# Patient Record
Sex: Female | Born: 1975 | Race: Black or African American | Hispanic: No | Marital: Married | Smoking: Never smoker
Health system: Southern US, Community
[De-identification: ages and names within clinical notes are randomized; demographics above are authoritative.]

---

## 1997-06-02 ENCOUNTER — Inpatient Hospital Stay (HOSPITAL_COMMUNITY): Admission: AD | Admit: 1997-06-02 | Discharge: 1997-06-02 | Payer: Self-pay | Admitting: Gynecology

## 1997-08-04 ENCOUNTER — Inpatient Hospital Stay (HOSPITAL_COMMUNITY): Admission: AD | Admit: 1997-08-04 | Discharge: 1997-08-04 | Payer: Self-pay | Admitting: Gynecology

## 1997-10-23 ENCOUNTER — Inpatient Hospital Stay (HOSPITAL_COMMUNITY): Admission: AD | Admit: 1997-10-23 | Discharge: 1997-10-28 | Payer: Self-pay | Admitting: Gynecology

## 1998-09-16 ENCOUNTER — Emergency Department (HOSPITAL_COMMUNITY): Admission: EM | Admit: 1998-09-16 | Discharge: 1998-09-16 | Payer: Self-pay | Admitting: Emergency Medicine

## 1999-06-10 ENCOUNTER — Ambulatory Visit (HOSPITAL_COMMUNITY): Admission: RE | Admit: 1999-06-10 | Discharge: 1999-06-10 | Payer: Self-pay | Admitting: *Deleted

## 1999-06-17 ENCOUNTER — Inpatient Hospital Stay (HOSPITAL_COMMUNITY): Admission: AD | Admit: 1999-06-17 | Discharge: 1999-06-17 | Payer: Self-pay | Admitting: Obstetrics & Gynecology

## 1999-06-21 ENCOUNTER — Inpatient Hospital Stay (HOSPITAL_COMMUNITY): Admission: AD | Admit: 1999-06-21 | Discharge: 1999-06-21 | Payer: Self-pay | Admitting: Obstetrics

## 1999-06-24 ENCOUNTER — Encounter: Admission: RE | Admit: 1999-06-24 | Discharge: 1999-06-24 | Payer: Self-pay | Admitting: Obstetrics

## 1999-08-05 ENCOUNTER — Inpatient Hospital Stay (HOSPITAL_COMMUNITY): Admission: AD | Admit: 1999-08-05 | Discharge: 1999-08-07 | Payer: Self-pay | Admitting: *Deleted

## 1999-08-05 ENCOUNTER — Encounter (INDEPENDENT_AMBULATORY_CARE_PROVIDER_SITE_OTHER): Payer: Self-pay

## 2003-02-27 ENCOUNTER — Emergency Department (HOSPITAL_COMMUNITY): Admission: EM | Admit: 2003-02-27 | Discharge: 2003-02-27 | Payer: Self-pay | Admitting: Emergency Medicine

## 2008-07-16 ENCOUNTER — Emergency Department (HOSPITAL_COMMUNITY): Admission: EM | Admit: 2008-07-16 | Discharge: 2008-07-16 | Payer: Self-pay | Admitting: Emergency Medicine

## 2009-05-12 ENCOUNTER — Inpatient Hospital Stay (HOSPITAL_COMMUNITY): Admission: AD | Admit: 2009-05-12 | Discharge: 2009-05-12 | Payer: Self-pay | Admitting: Obstetrics & Gynecology

## 2010-04-21 LAB — WET PREP, GENITAL

## 2010-04-21 LAB — GC/CHLAMYDIA PROBE AMP, GENITAL
Chlamydia, DNA Probe: NEGATIVE
GC Probe Amp, Genital: NEGATIVE

## 2010-04-21 LAB — SAMPLE TO BLOOD BANK

## 2010-04-21 LAB — CBC
HCT: 41 % (ref 36.0–46.0)
Hemoglobin: 13.4 g/dL (ref 12.0–15.0)
MCHC: 32.8 g/dL (ref 30.0–36.0)
MCV: 84.4 fL (ref 78.0–100.0)
Platelets: 197 10*3/uL (ref 150–400)
RBC: 4.86 MIL/uL (ref 3.87–5.11)
RDW: 12.9 % (ref 11.5–15.5)
WBC: 6.9 10*3/uL (ref 4.0–10.5)

## 2010-04-21 LAB — POCT PREGNANCY, URINE: Preg Test, Ur: NEGATIVE

## 2010-06-18 NOTE — Op Note (Signed)
[facility]  Patient:    Mary Roy, Mary Roy                   MRN: [id number] Proc. Date: 08/06/99 Adm. Date:  [id number] Attending:  Michaelle Copas                           Operative Report  PREOPERATIVE DIAGNOSIS:       Desires sterilization.  POSTOPERATIVE DIAGNOSIS:      Desires sterilization.  OPERATION:                    Bilateral partial salpingectomy ([place]                               technique).  SURGEON:                      Charles A. Clearance Coots, M.D.  ANESTHESIA:                   General.  ESTIMATED BLOOD LOSS:         Negligible.  COMPLICATIONS:                None.  SPECIMENS:                    Approximately 2-cm segments of right and left                               fallopian tube.  DESCRIPTION OF PROCEDURE:     The patient was brought to the operating room and after satisfactory general endotracheal anesthesia, the abdomen was prepped and draped in the usual sterile fashion.  A small inferior umbilical incision was made with the scalpel.  It was deepened down to the fascia with curved Mayo scissors.  The fascia was grasped in the midline with Kelly forceps and was cut transversely with curved Mayo scissors.  The incision was extended to the left and to the right with the curved Mayo scissors.  The peritoneum was grasped with hemostats and was incised with Metzenbaum scissors , and the incision was extended to the left and to the right with the Metzenbaum scissors.  Right angle retractors were placed in the incision, and the right fallopian tube was identified and was grasped with a Babcock clamp. The tube was followed from the cornual end to the fimbrial end serially, and then grasped with Babcock clamps, and then the tube was followed retrograde back to the isthmic area of the tube, and grasped with Babcock clamps and the knuckle of tube beneath the Babcock clamp in the isthmic area of the tube was doubly  ligated with #1 plain catgut, and the section of tube above the knot was excised with Metzenbaum scissors and submitted to pathology for evaluation.  There was no active bleeding from the tubal stump.  It was therefore placed back in the normal anatomic position.  The same procedure was performed on the opposite side without complications.  The abdomen was then closed as follows:  Peritoneum and fascia were closed as one with a continuous suture of 2-0 Vicryl.  The subcutaneous tissue was approximated with a few interrupted sutures of 2-0 Vicryl.  The skin was closed with a subcuticular continuous suture of 4-0 Monocryl.  Sterile  bandage was applied to the incision closure.  Surgical technician indicated all needle, sponge, and instrument counts were correct.  The patient tolerated the procedure well and was transported to the recovery room in satisfactory condition. DD:  08/06/99 TD:  08/06/99 Job: [id number]

## 2015-07-07 ENCOUNTER — Emergency Department (HOSPITAL_COMMUNITY)
Admission: EM | Admit: 2015-07-07 | Discharge: 2015-07-07 | Disposition: A | Discharge status: Home / Self Care | Payer: Self-pay | Attending: Emergency Medicine | Admitting: Emergency Medicine

## 2015-07-07 ENCOUNTER — Encounter (HOSPITAL_COMMUNITY): Payer: Self-pay

## 2015-07-07 DIAGNOSIS — H5789 Other specified disorders of eye and adnexa: Secondary | ICD-10-CM

## 2015-07-07 DIAGNOSIS — H578 Other specified disorders of eye and adnexa: Secondary | ICD-10-CM | POA: Insufficient documentation

## 2015-07-07 DIAGNOSIS — H53149 Visual discomfort, unspecified: Secondary | ICD-10-CM | POA: Insufficient documentation

## 2015-07-07 DIAGNOSIS — R32 Unspecified urinary incontinence: Secondary | ICD-10-CM | POA: Insufficient documentation

## 2015-07-07 MED ORDER — FLUORESCEIN SODIUM 1 MG OP STRP
1.0000 | ORAL_STRIP | Freq: Once | OPHTHALMIC | Status: AC
  Administered 2015-07-07: 1 via OPHTHALMIC
  Filled 2015-07-07: qty 1

## 2015-07-07 MED ORDER — PROPARACAINE HCL 0.5 % OP SOLN
1.0000 [drp] | Freq: Once | OPHTHALMIC | Status: AC
  Administered 2015-07-07: 1 [drp] via OPHTHALMIC
  Filled 2015-07-07: qty 15

## 2015-07-07 MED ORDER — CIPROFLOXACIN HCL 0.3 % OP SOLN
2.0000 [drp] | Freq: Four times a day (QID) | OPHTHALMIC | Status: DC
  Administered 2015-07-07: 2 [drp] via OPHTHALMIC
  Filled 2015-07-07: qty 2.5

## 2015-07-07 MED ORDER — BACITRACIN-POLYMYXIN B 500-10000 UNIT/GM OP OINT: TOPICAL_OINTMENT | OPHTHALMIC | Status: DC

## 2015-07-07 MED ORDER — BACITRACIN-POLYMYXIN B 500-10000 UNIT/GM OP OINT
TOPICAL_OINTMENT | Freq: Every day | OPHTHALMIC | Status: DC
  Filled 2015-07-07: qty 3.5

## 2015-07-07 NOTE — ED Notes (Signed)
Patient complains of right eye pain and redness x 1 day. No blurred vision but pain with eye movement and photophobia.

## 2015-07-07 NOTE — ED Notes (Signed)
Patient verbalized understanding of discharge instructions and denies any further needs or questions at this time. VS stable. Patient ambulatory with steady gait, refused wheelchair.

## 2015-07-07 NOTE — ED Provider Notes (Signed)
CSN: [id number]     Arrival date & time 07/07/15  1558 History  By signing my name below, I, Rosario Adie, attest that this documentation has been prepared under the direction and in the presence of Cheri Fowler, PA-C.   Electronically Signed: Rosario Adie, ED Scribe. 07/07/2015. 5:09 PM.   Chief Complaint  Patient presents with  . Eye Pain   HPI HPI Comments: Mary Roy is a 40 y.o. female who wears corrective contact lenses with no pertinent PMHx presenting to the Emergency Department complaining of sudden onset, gradually worsening, dull right eye pain x one day ago. Pt has associated redness to her eye and photophobia. Pt reports that pain is worsened upon movement and when she sees light. She states that when she closes her eye she feels pressure. Pt reports that last night her eye became red, pruritic and she tried to rub her eye with no relief. Pt denies any known trauma to her eye. Pt was seen at [facility] and she states that they did not find evidence of corneal abrasions upon eye examination. Pt denies double vision, acute on chronic blurred vision (without contact), eye discharge, sensation of foreign body, fever, chills, HA, numbness or weakness, urinary incontinence, or gait problems. No OTC medications or home remedies tried PTA. Pt denies sick contact with similar symptoms.   History reviewed. No pertinent past medical history. History reviewed. No pertinent past surgical history. No family history on file. Social History  Substance Use Topics  . Smoking status: Never Smoker   . Smokeless tobacco: None  . Alcohol Use: None   OB History    No data available     Review of Systems  Constitutional: Negative for fever and chills.  Eyes: Positive for photophobia, pain, redness and itching. Negative for discharge and visual disturbance.  Genitourinary:       -urinary incontinence  Musculoskeletal: Negative for gait problem.  Neurological: Negative for  weakness, numbness and headaches.  All other systems reviewed and are negative.  Allergies  Review of patient's allergies indicates no known allergies.  Home Medications   Prior to Admission medications   Medication Sig Start Date End Date Taking? Authorizing Provider  bacitracin-polymyxin b (POLYSPORIN) ophthalmic ointment apply to eye daily at bedtime 07/07/15   Cheri Fowler, PA-C   BP 119/78 mmHg  Pulse 68  Temp(Src) 98.4 F (36.9 C) (Oral)  Resp 18  Wt 71.668 kg  SpO2 100%  LMP 06/08/2015   Physical Exam  Constitutional: She is oriented to person, place, and time. She appears well-developed and well-nourished.  Non-toxic appearance. She does not have a sickly appearance. She does not appear ill.  HENT:  Head: Normocephalic and atraumatic.  Right Ear: External ear normal.  Left Ear: External ear normal.  Mouth/Throat: Oropharynx is clear and moist.  Eyes: EOM and lids are normal. Pupils are equal, round, and reactive to light. Lids are everted and swept, no foreign bodies found. Right eye exhibits no chemosis, no discharge, no exudate and no hordeolum. No foreign body present in the right eye. Left eye exhibits no discharge. Right conjunctiva is injected. Left conjunctiva is not injected. No scleral icterus.  Slit lamp exam:      The right eye shows no corneal abrasion, no corneal ulcer, no foreign body, no hyphema and no fluorescein uptake.  Neck: Normal range of motion. Neck supple. No tracheal deviation present.  Cardiovascular: Normal rate and regular rhythm.   Pulmonary/Chest: Effort normal and breath  sounds normal. No accessory muscle usage or stridor. No respiratory distress. She has no wheezes. She has no rhonchi. She has no rales.  Abdominal: Soft. Bowel sounds are normal. She exhibits no distension. There is no tenderness.  Musculoskeletal: Normal range of motion.  Lymphadenopathy:    She has no cervical adenopathy.  Neurological: She is alert and oriented to person,  place, and time.  Speech clear without dysarthria.  Skin: Skin is warm and dry.  Psychiatric: She has a normal mood and affect. Her behavior is normal.    ED Course  Procedures (including critical care time)  DIAGNOSTIC STUDIES: Oxygen Saturation is 100% on RA, normal by my interpretation.   COORDINATION OF CARE: 5:08 PM-Discussed next steps with pt including Fluorescin uptake exam and Opthalmology consult. Pt verbalized understanding and is agreeable with the plan.   Labs Review Labs Reviewed - No data to display  Imaging Review No results found. I have personally reviewed and evaluated these images and lab results as part of my medical decision-making.  MDM   Final diagnoses:  Eye irritation   Patient presents with right eye pain x 1 day.  Contact lens wearer.  Photophobia.  No neurological symptoms.  No trauma.  VSS, NAD.  On exam, conjunctiva injected.  No fluorescein uptake.  No corneal abrasion/ulcer. No FB.  Doubt optic neuritis or acute angle glaucoma.  Possible uveitis?  Patient seen by Dr. Dione BoozeGroat.  Discharge with Ciloxan and Polysporin ophthalmic ointment.  Follow up Thursday.  Discussed return precautions.  Patient agrees and acknowledges the above plan for discharge.   I personally performed the services described in this documentation, which was scribed in my presence. The recorded information has been reviewed and is accurate.     Cheri FowlerKayla Sultan Pargas, PA-C 07/08/15 0125  Pricilla LovelessScott Goldston, MD 07/13/15 [phone]

## 2015-07-07 NOTE — Discharge Instructions (Signed)
Uveitis °Uveitis is swelling and irritation (inflammation) in the eye. It often affects the middle part of the eye (uvea). This area contains many of the blood vessels that supply the rest of the eye. The uvea is made up of three structures: °· The middle layer of the eyeball (choroid). °· The colored part of the front of the eye (iris). °· The connection between the iris and the choroid (ciliary body). °Uveitis can affect any part of the uvea as well as other important structures in the eye. There are many types of uveitis: °· Iritis, or anterior uveitis, affects the iris. This is the most common type. It can start suddenly and can last for many weeks. °· Intermediate uveitis affects the structures in the middle of the eye. This includes the fluid that fills the eye (vitreous). This type can last for years and can come and go. °· Posterior uveitis affects the structures in the back of the eye. This includes the light-sensitive layer of cells that is needed for vision (retina). This is the least common type. °· Panuveitis affects all layers of the eye. °Uveitis can affect one eye or both eyes. It can cause short-term or long-term symptoms. Symptoms may go away and come back. Over time, the condition can damage or destroy eye structures and can lead to vision loss. °CAUSES °This condition may be caused by: °· Infections that start in the eye or spread to the eye. °· Inflammatory diseases that can affect the eye. °· Autoimmune diseases. These are diseases in which the body's defense system (immune system) mistakenly attacks the body's own tissues. °· Eye injuries. °In some cases, the cause may not be known. °RISK FACTORS °This condition is more likely to develop in people who are 20-60 years old. °SYMPTOMS °Symptoms of this condition depend on the type of uveitis. Common symptoms include: °· Eye redness. °· Eye pain. °· Blurred vision. °· Decreased vision. °· Floating dark spots in your vision  (floaters). °· Sensitivity to light. °· A white spot in the lower part of the iris (hypopyon). °DIAGNOSIS °This condition is usually diagnosed by an eye specialist (ophthalmologist). The ophthalmologist will do a complete eye exam. This exam may include: °· A vision test using eye charts. °· An exam that involves using a scope for viewing inside the eye (ophthalmoscope or slit lamp). Eye drops may be used to widen (dilate) your pupil to make it easier to see inside your eye. °· A test to measure eye pressure. °You may have other medical tests to help determine the cause of your uveitis. °TREATMENT °Treatment for this condition may depend on the type of uveitis that you have. It should be started right away to help prevent vision loss. Treatment may include: °· Medicine to block inflammation (steroids). You may get steroids: °¨ As eye drops. °¨ As an injection into your eye. °¨ By mouth. °· Eye drops to dilate the pupil and reduce pressure inside the eye. °· In some cases, medicines may be given through an IV tube or through a device that is implanted inside the eye. °HOME CARE INSTRUCTIONS °· Take medicines only as directed by your health care provider. Use eye drops exactly as directed. °· Follow instructions from your health care provider about any restrictions on your activities. Ask what activities are safe for you. °· Do not use any tobacco products, including cigarettes, chewing tobacco, or electronic cigarettes. If you need help quitting, ask your health care provider. °· Keep all follow-up visits   as directed by your health care provider. This is important. °SEEK MEDICAL CARE IF: °· Your medicines are not working. °SEEK IMMEDIATE MEDICAL CARE IF: °· You have redness in one eye or both eyes. °· Your eyes are very sensitive to light. °· You have pain or aching in either eye. °· You have vision loss in either eye. °  °This information is not intended to replace advice given to you by your health care provider.  Make sure you discuss any questions you have with your health care provider. °  °Document Released: 04/15/2008 Document Revised: 06/03/2014 Document Reviewed: 01/22/2014 °Elsevier Interactive Patient Education ©2016 [employer] ° °

## 2016-03-03 ENCOUNTER — Encounter (HOSPITAL_COMMUNITY): Payer: Self-pay

## 2016-03-03 ENCOUNTER — Emergency Department (HOSPITAL_COMMUNITY)
Admission: EM | Admit: 2016-03-03 | Discharge: 2016-03-04 | Disposition: A | Discharge status: Home / Self Care | Payer: Self-pay | Attending: Emergency Medicine | Admitting: Emergency Medicine

## 2016-03-03 DIAGNOSIS — Z79899 Other long term (current) drug therapy: Secondary | ICD-10-CM | POA: Insufficient documentation

## 2016-03-03 DIAGNOSIS — R112 Nausea with vomiting, unspecified: Secondary | ICD-10-CM | POA: Insufficient documentation

## 2016-03-03 DIAGNOSIS — R197 Diarrhea, unspecified: Secondary | ICD-10-CM | POA: Insufficient documentation

## 2016-03-03 LAB — CBC
HEMATOCRIT: 43.4 % (ref 36.0–46.0)
Hemoglobin: 14.5 g/dL (ref 12.0–15.0)
MCH: 26.2 pg (ref 26.0–34.0)
MCHC: 33.4 g/dL (ref 30.0–36.0)
MCV: 78.5 fL (ref 78.0–100.0)
Platelets: 204 10*3/uL (ref 150–400)
RBC: 5.53 MIL/uL — ABNORMAL HIGH (ref 3.87–5.11)
RDW: 12.6 % (ref 11.5–15.5)
WBC: 8.9 10*3/uL (ref 4.0–10.5)

## 2016-03-03 LAB — COMPREHENSIVE METABOLIC PANEL
ALBUMIN: 4.5 g/dL (ref 3.5–5.0)
ALT: 12 U/L — ABNORMAL LOW (ref 14–54)
AST: 20 U/L (ref 15–41)
Alkaline Phosphatase: 81 U/L (ref 38–126)
Anion gap: 7 (ref 5–15)
BILIRUBIN TOTAL: 0.7 mg/dL (ref 0.3–1.2)
BUN: 14 mg/dL (ref 6–20)
CHLORIDE: 107 mmol/L (ref 101–111)
CO2: 26 mmol/L (ref 22–32)
Calcium: 9.6 mg/dL (ref 8.9–10.3)
Creatinine, Ser: 1.01 mg/dL — ABNORMAL HIGH (ref 0.44–1.00)
GFR calc Af Amer: >60 mL/min (ref >60)
GFR calc non Af Amer: >60 mL/min (ref >60)
GLUCOSE: 133 mg/dL — AB (ref 65–99)
POTASSIUM: 4 mmol/L (ref 3.5–5.1)
Sodium: 140 mmol/L (ref 135–145)
Total Protein: 8 g/dL (ref 6.5–8.1)

## 2016-03-03 LAB — I-STAT BETA HCG BLOOD, ED (MC, WL, AP ONLY): I-stat hCG, quantitative: <5 m[IU]/mL (ref <5)

## 2016-03-03 LAB — LIPASE, BLOOD: Lipase: 17 U/L (ref 11–51)

## 2016-03-03 NOTE — ED Triage Notes (Signed)
Pt c/o of sudden onset generalized abdominal pain accompanied by N/V/D and chills. Denies injury or eating new foods. Denies chest pain or SOB. A&Ox4. Ambulatory.

## 2016-03-03 NOTE — ED Provider Notes (Signed)
[facility]-EMERGENCY DEPT Provider Note   CSN: [id number] Arrival date & time: 03/03/16  2054  By signing my name below, I, Mary Roy, attest that this documentation has been prepared under the direction and in the presence of Dione Booze, MD. Electronically Signed: Modena Roy, Scribe. 03/03/2016. 12:05 AM.  History   Chief Complaint Chief Complaint  Patient presents with  . Emesis  . Chills  . Abdominal Pain   The history is provided by the patient. No language interpreter was used.   HPI Comments: Mary Roy is a 41 y.o. female who presents to the Emergency Department complaining of vomiting that started today. She states she had a sudden onset of nausea, vomiting, and diarrhea. She ate beef today after a year of no beef consumption, no other suspected agents. She has associated constant moderate abdominal pain which she rates as a 8/10. She denies any hx of smoking/drinking, sick contacts, blood in stool, hematochezia, or other complaints.   History reviewed. No pertinent past medical history.  There are no active problems to display for this patient.   History reviewed. No pertinent surgical history.  OB History    No data available       Home Medications    Prior to Admission medications   Medication Sig Start Date End Date Taking? Authorizing Provider  bacitracin-polymyxin b (POLYSPORIN) ophthalmic ointment apply to eye daily at bedtime 07/07/15   Cheri Fowler, PA-C    Family History History reviewed. No pertinent family history.  Social History Social History  Substance Use Topics  . Smoking status: Never Smoker  . Smokeless tobacco: Never Used  . Alcohol use Not on file     Allergies   Patient has no known allergies.   Review of Systems Review of Systems  Gastrointestinal: Positive for abdominal pain, nausea and vomiting. Negative for blood in stool.  All other systems reviewed and are negative.    Physical Exam Updated Vital Signs BP 126/84  (BP Location: Right Arm)   Pulse 90   Temp 97.7 F (36.5 C) (Oral)   Resp 20   Wt 155 lb (70.3 kg)   SpO2 99%   Physical Exam  Constitutional: She is oriented to person, place, and time. She appears well-developed and well-nourished.  HENT:  Head: Normocephalic and atraumatic.  Eyes: EOM are normal. Pupils are equal, round, and reactive to light.  Neck: Normal range of motion. Neck supple. No JVD present.  Cardiovascular: Normal rate, regular rhythm and normal heart sounds.   No murmur heard. Pulmonary/Chest: Effort normal and breath sounds normal. She has no wheezes. She has no rales. She exhibits no tenderness.  Abdominal: Soft. Bowel sounds are normal. She exhibits no distension and no mass. There is no tenderness.  Periumbilical TTP with no rebound or guarding. Decreased bowel sounds.   Musculoskeletal: Normal range of motion. She exhibits no edema.  Lymphadenopathy:    She has no cervical adenopathy.  Neurological: She is alert and oriented to person, place, and time. No cranial nerve deficit. She exhibits normal muscle tone. Coordination normal.  Skin: Skin is warm and dry. No rash noted.  Psychiatric: She has a normal mood and affect. Her behavior is normal. Judgment and thought content normal.  Nursing note and vitals reviewed.    ED Treatments / Results  DIAGNOSTIC STUDIES: Oxygen Saturation is 99% on RA, normal by my interpretation.    COORDINATION OF CARE: 12:09 AM- Pt advised of plan for treatment and pt agrees.  Labs (all  labs ordered are listed, but only abnormal results are displayed) Labs Reviewed  COMPREHENSIVE METABOLIC PANEL - Abnormal; Notable for the following:       Result Value   Glucose, Bld 133 (*)    Creatinine, Ser 1.01 (*)    ALT 12 (*)    All other components within normal limits  CBC - Abnormal; Notable for the following:    RBC 5.53 (*)    All other components within normal limits  URINALYSIS, ROUTINE W REFLEX MICROSCOPIC - Abnormal;  Notable for the following:    Ketones, ur 5 (*)    All other components within normal limits  LIPASE, BLOOD  I-STAT BETA HCG BLOOD, ED ([facility], [facility], [facility] ONLY)    Procedures Procedures (including critical care time)  Medications Ordered in ED Medications  sodium chloride 0.9 % bolus 1,000 mL (1,000 mLs Intravenous New Bag/Given 03/04/16 0013)  ondansetron (ZOFRAN) injection 4 mg (4 mg Intravenous Given 03/04/16 0014)  loperamide (IMODIUM) capsule 4 mg (4 mg Oral Given 03/04/16 0027)  acetaminophen (TYLENOL) tablet 650 mg (650 mg Oral Given 03/04/16 0027)     Initial Impression / Assessment and Plan / ED Course  I have reviewed the triage vital signs and the nursing notes.  Pertinent lab results that were available during my care of the patient were reviewed by me and considered in my medical decision making (see chart for details).  Nausea, vomiting, diarrhea in pattern consistent with viral gastritis versus possible food poisoning. No red flags to digest more serious illness. Old records are reviewed, and she has no relevant past visits. She is given oral loperamide, IV fluids, IV ondansetron. Following this, diarrhea did improve, but she continued to have nausea. She was given metoclopramide with no improvement in nausea. She is given a second dose of ondansetron with good relief of symptoms. She is tolerating oral fluids at this point and is discharged with prescription for ondansetron.  Final Clinical Impressions(s) / ED Diagnoses   Final diagnoses:  Nausea vomiting and diarrhea    New Prescriptions Discharge Medication List as of 03/04/2016  7:01 AM    START taking these medications   Details  ondansetron (ZOFRAN) 4 MG tablet Take 1 tablet (4 mg total) by mouth every 6 (six) hours., Starting Fri 03/04/2016, Print       I personally performed the services described in this documentation, which was scribed in my presence. The recorded information has been reviewed and is accurate.         Dione Boozeavid Sharicka Pogorzelski, MD 03/04/16 [phone]

## 2016-03-04 LAB — URINALYSIS, ROUTINE W REFLEX MICROSCOPIC
BILIRUBIN URINE: NEGATIVE
GLUCOSE, UA: NEGATIVE mg/dL
HGB URINE DIPSTICK: NEGATIVE
Ketones, ur: 5 mg/dL — AB
Leukocytes, UA: NEGATIVE
Nitrite: NEGATIVE
PH: 5 (ref 5.0–8.0)
Protein, ur: NEGATIVE mg/dL
SPECIFIC GRAVITY, URINE: 1.012 (ref 1.005–1.030)

## 2016-03-04 MED ORDER — MORPHINE SULFATE (PF) 4 MG/ML IV SOLN
4.0000 mg | Freq: Once | INTRAVENOUS | Status: DC
  Filled 2016-03-04: qty 1

## 2016-03-04 MED ORDER — ONDANSETRON HCL 4 MG PO TABS: 4.0000 mg | ORAL_TABLET | Freq: Four times a day (QID) | ORAL | 0 refills | Status: DC

## 2016-03-04 MED ORDER — METOCLOPRAMIDE HCL 5 MG/ML IJ SOLN
10.0000 mg | Freq: Once | INTRAMUSCULAR | Status: AC
  Administered 2016-03-04: 10 mg via INTRAVENOUS
  Filled 2016-03-04: qty 2

## 2016-03-04 MED ORDER — ONDANSETRON HCL 4 MG/2ML IJ SOLN
4.0000 mg | Freq: Once | INTRAMUSCULAR | Status: AC
  Administered 2016-03-04: 4 mg via INTRAVENOUS
  Filled 2016-03-04: qty 2

## 2016-03-04 MED ORDER — ACETAMINOPHEN 325 MG PO TABS
650.0000 mg | ORAL_TABLET | Freq: Once | ORAL | Status: AC
  Administered 2016-03-04: 650 mg via ORAL
  Filled 2016-03-04: qty 2

## 2016-03-04 MED ORDER — LOPERAMIDE HCL 2 MG PO CAPS
4.0000 mg | ORAL_CAPSULE | Freq: Once | ORAL | Status: AC
  Administered 2016-03-04: 4 mg via ORAL
  Filled 2016-03-04: qty 2

## 2016-03-04 MED ORDER — DIPHENHYDRAMINE HCL 50 MG/ML IJ SOLN
25.0000 mg | Freq: Once | INTRAMUSCULAR | Status: AC
  Administered 2016-03-04: 25 mg via INTRAVENOUS
  Filled 2016-03-04: qty 1

## 2016-03-04 MED ORDER — SODIUM CHLORIDE 0.9 % IV BOLUS (SEPSIS)
1000.0000 mL | Freq: Once | INTRAVENOUS | Status: AC
  Administered 2016-03-04: 1000 mL via INTRAVENOUS

## 2016-03-04 NOTE — Discharge Instructions (Signed)
Take loperamide (Imidium AD) as needed for diarrhea.

## 2018-03-04 ENCOUNTER — Other Ambulatory Visit: Payer: Self-pay

## 2018-03-04 ENCOUNTER — Encounter (HOSPITAL_COMMUNITY): Payer: Self-pay

## 2018-03-04 ENCOUNTER — Emergency Department (HOSPITAL_COMMUNITY)
Admission: EM | Admit: 2018-03-04 | Discharge: 2018-03-04 | Disposition: A | Discharge status: Home / Self Care | Payer: Self-pay | Attending: Emergency Medicine | Admitting: Emergency Medicine

## 2018-03-04 ENCOUNTER — Emergency Department (HOSPITAL_COMMUNITY): Payer: Self-pay

## 2018-03-04 DIAGNOSIS — J09X2 Influenza due to identified novel influenza A virus with other respiratory manifestations: Secondary | ICD-10-CM | POA: Insufficient documentation

## 2018-03-04 DIAGNOSIS — J101 Influenza due to other identified influenza virus with other respiratory manifestations: Secondary | ICD-10-CM

## 2018-03-04 DIAGNOSIS — R079 Chest pain, unspecified: Secondary | ICD-10-CM

## 2018-03-04 LAB — BASIC METABOLIC PANEL
Anion gap: 9 (ref 5–15)
BUN: 7 mg/dL (ref 6–20)
CO2: 26 mmol/L (ref 22–32)
Calcium: 9.1 mg/dL (ref 8.9–10.3)
Chloride: 103 mmol/L (ref 98–111)
Creatinine, Ser: 0.95 mg/dL (ref 0.44–1.00)
GFR calc Af Amer: >60 mL/min (ref >60)
GFR calc non Af Amer: >60 mL/min (ref >60)
Glucose, Bld: 101 mg/dL — ABNORMAL HIGH (ref 70–99)
Potassium: 4 mmol/L (ref 3.5–5.1)
Sodium: 138 mmol/L (ref 135–145)

## 2018-03-04 LAB — CBC
HEMATOCRIT: 45.8 % (ref 36.0–46.0)
Hemoglobin: 14.2 g/dL (ref 12.0–15.0)
MCH: 25.5 pg — ABNORMAL LOW (ref 26.0–34.0)
MCHC: 31 g/dL (ref 30.0–36.0)
MCV: 82.4 fL (ref 80.0–100.0)
Platelets: 197 10*3/uL (ref 150–400)
RBC: 5.56 MIL/uL — ABNORMAL HIGH (ref 3.87–5.11)
RDW: 13.5 % (ref 11.5–15.5)
WBC: 3.3 10*3/uL — ABNORMAL LOW (ref 4.0–10.5)
nRBC: 0 % (ref 0.0–0.2)

## 2018-03-04 LAB — I-STAT TROPONIN, ED: Troponin i, poc: 0 ng/mL (ref 0.00–0.08)

## 2018-03-04 LAB — INFLUENZA PANEL BY PCR (TYPE A & B)
Influenza A By PCR: POSITIVE — AB
Influenza B By PCR: NEGATIVE

## 2018-03-04 LAB — I-STAT BETA HCG BLOOD, ED (MC, WL, AP ONLY): I-stat hCG, quantitative: <5 m[IU]/mL (ref <5)

## 2018-03-04 MED ORDER — SODIUM CHLORIDE 0.9% FLUSH
3.0000 mL | Freq: Once | INTRAVENOUS | Status: AC
  Administered 2018-03-04: 3 mL via INTRAVENOUS

## 2018-03-04 MED ORDER — OSELTAMIVIR PHOSPHATE 75 MG PO CAPS: 75.0000 mg | ORAL_CAPSULE | Freq: Two times a day (BID) | ORAL | 0 refills | Status: DC

## 2018-03-04 NOTE — ED Provider Notes (Signed)
[facility]-EMERGENCY DEPT Provider Note   CSN: [id number] Arrival date & time: 03/04/18  0906     History   Chief Complaint Chief Complaint  Patient presents with  . URI    HPI Mary Roy is a 42 y.o. female.  She is complaining of cough, chest pain, nasal congestion, body aches and breath since Tuesday.  She just returned from a trip to [place] on Monday.  She has had no fevers.  The chest pain is intermittent sometimes with cough sometimes at rest.  She has had no leg pain or swelling.  She did not get a flu shot.  No sick contacts.  The history is provided by the patient.  Cough  Cough characteristics:  Productive Sputum characteristics:  Yellow Severity:  Moderate Onset quality:  Sudden Duration:  6 days Timing:  Intermittent Progression:  Unchanged Chronicity:  New Smoker: no   Context: upper respiratory infection   Relieved by:  Nothing Worsened by:  Nothing Ineffective treatments:  None tried Associated symptoms: chest pain, myalgias, rhinorrhea, shortness of breath and sore throat   Associated symptoms: no chills, no ear pain, no fever, no headaches, no rash, no sinus congestion, no weight loss and no wheezing   Risk factors: recent travel     History reviewed. No pertinent past medical history.  There are no active problems to display for this patient.   No past surgical history on file.   OB History   No obstetric history on file.      Home Medications    Prior to Admission medications   Medication Sig Start Date End Date Taking? Authorizing Provider  ondansetron (ZOFRAN) 4 MG tablet Take 1 tablet (4 mg total) by mouth every 6 (six) hours. 03/04/16   Dione Booze, MD    Family History No family history on file.  Social History Social History   Tobacco Use  . Smoking status: Never Smoker  . Smokeless tobacco: Never Used  Substance Use Topics  . Alcohol use: Not on file  . Drug use: Not on file     Allergies     Patient has no known allergies.   Review of Systems Review of Systems  Constitutional: Negative for chills, fever and weight loss.  HENT: Positive for rhinorrhea and sore throat. Negative for ear pain.   Eyes: Negative for visual disturbance.  Respiratory: Positive for cough and shortness of breath. Negative for wheezing.   Cardiovascular: Positive for chest pain.  Gastrointestinal: Negative for abdominal pain.  Genitourinary: Negative for dysuria.  Musculoskeletal: Positive for myalgias.  Skin: Negative for rash.  Neurological: Negative for headaches.     Physical Exam Updated Vital Signs BP 112/81   Pulse (!) 101   Temp 98.5 F (36.9 C) (Oral)   Resp 17   Ht 5\' 7"  (1.702 m)   Wt 71.7 kg   LMP 02/19/2018   SpO2 98%   BMI 24.75 kg/m   Physical Exam Vitals signs and nursing note reviewed.  Constitutional:      General: She is not in acute distress.    Appearance: She is well-developed.  HENT:     Head: Normocephalic and atraumatic.  Eyes:     Conjunctiva/sclera: Conjunctivae normal.  Neck:     Musculoskeletal: Neck supple.  Cardiovascular:     Rate and Rhythm: Regular rhythm. Tachycardia present.     Heart sounds: No murmur.  Pulmonary:     Effort: Pulmonary effort is normal. No respiratory distress.  Breath sounds: Normal breath sounds.  Abdominal:     Palpations: Abdomen is soft.     Tenderness: There is no abdominal tenderness.  Musculoskeletal: Normal range of motion.        General: No signs of injury.     Right lower leg: No edema.     Left lower leg: No edema.  Skin:    General: Skin is warm and dry.     Capillary Refill: Capillary refill takes less than 2 seconds.  Neurological:     General: No focal deficit present.     Mental Status: She is alert and oriented to person, place, and time.      ED Treatments / Results  Labs (all labs ordered are listed, but only abnormal results are displayed) Labs Reviewed  BASIC METABOLIC PANEL -  Abnormal; Notable for the following components:      Result Value   Glucose, Bld 101 (*)    All other components within normal limits  CBC - Abnormal; Notable for the following components:   WBC 3.3 (*)    RBC 5.56 (*)    MCH 25.5 (*)    All other components within normal limits  INFLUENZA PANEL BY PCR (TYPE A & B) - Abnormal; Notable for the following components:   Influenza A By PCR POSITIVE (*)    All other components within normal limits  I-STAT TROPONIN, ED  I-STAT BETA HCG BLOOD, ED ([facility], [facility], [facility] ONLY)    EKG EKG Interpretation  Date/Time:  Sunday March 04 2018 09:30:27 EST Ventricular Rate:  102 PR Interval:    QRS Duration: 81 QT Interval:  333 QTC Calculation: 434 R Axis:   80 Text Interpretation:  Sinus tachycardia Abnormal T, consider ischemia, inferior leads no prior to compare with Confirmed by Meridee Score [phone]) on 03/04/2018 9:53:53 AM Also confirmed by Meridee Score [phone]), editor Barbette Hair [phone])  on 03/04/2018 1:39:33 PM   Radiology Dg Chest 2 View  Result Date: 03/04/2018 CLINICAL DATA:  Patient presented to ed with c/o sob, chest pain, body pain and cough. Patient state she started to cough up yellow sputum. Weakness. EXAM: CHEST - 2 VIEW COMPARISON:  None. FINDINGS: The heart size and mediastinal contours are within normal limits. Both lungs are clear. No pleural effusion or pneumothorax. The visualized skeletal structures are unremarkable. IMPRESSION: Normal chest radiographs. Electronically Signed   By: Amie Portland M.D.   On: 03/04/2018 10:09    Procedures Procedures (including critical care time)  Medications Ordered in ED Medications  sodium chloride flush (NS) 0.9 % injection 3 mL (3 mLs Intravenous Given 03/04/18 0946)     Initial Impression / Assessment and Plan / ED Course  I have reviewed the triage vital signs and the nursing notes.  Pertinent labs & imaging results that were available during my care of the patient were reviewed  by me and considered in my medical decision making (see chart for details).  Clinical Course as of Mar 04 1516  Wynelle Link Mar 04, 2018  1043 Patient's work-up significant for being positive for influenza A.  Her chest x-ray and troponin were normal.  White blood cell count slightly depressed which goes along with a viral illness.  Suspicion low for PE as she currently has an upper respiratory infection which I think would account for most of her symptoms.  Reviewed all this with the patient and she is comfortable with plan all questions answered.   [MB]    Clinical Course  User Index [MB] Terrilee FilesButler, Michael C, MD     Final Clinical Impressions(s) / ED Diagnoses   Final diagnoses:  Influenza A  Nonspecific chest pain    ED Discharge Orders         Ordered    oseltamivir (TAMIFLU) 75 MG capsule  Every 12 hours     03/04/18 1045           Terrilee FilesButler, Michael C, MD 03/04/18 [phone]

## 2018-03-04 NOTE — Discharge Instructions (Addendum)
You were evaluated in the emergency department for cough chest pain shortness of breath.  Your chest x-ray did not show an obvious pneumonia.  You were positive for influenza type today.  We are prescribing you some Tamiflu which may help with your symptoms.  You should stay well-hydrated and use Tylenol and ibuprofen for aches and pains and fever.  Follow-up with your doctor and return if any worsening symptoms.

## 2018-03-04 NOTE — ED Triage Notes (Signed)
Patient presented to ed with c/o sob, chest pain, body pain and cough. Patient state she started to cough up yellow sputum. Patient also state she recently got back from [place] and she been sick ever since she got back.

## 2018-03-04 NOTE — ED Notes (Signed)
Patient transported to X-ray 

## 2021-02-19 ENCOUNTER — Other Ambulatory Visit: Payer: Self-pay

## 2021-02-19 ENCOUNTER — Emergency Department (HOSPITAL_BASED_OUTPATIENT_CLINIC_OR_DEPARTMENT_OTHER): Payer: Self-pay | Admitting: Radiology

## 2021-02-19 ENCOUNTER — Encounter (HOSPITAL_BASED_OUTPATIENT_CLINIC_OR_DEPARTMENT_OTHER): Payer: Self-pay | Admitting: Emergency Medicine

## 2021-02-19 DIAGNOSIS — M25512 Pain in left shoulder: Secondary | ICD-10-CM | POA: Insufficient documentation

## 2021-02-19 DIAGNOSIS — R002 Palpitations: Secondary | ICD-10-CM | POA: Insufficient documentation

## 2021-02-19 DIAGNOSIS — M79602 Pain in left arm: Secondary | ICD-10-CM | POA: Insufficient documentation

## 2021-02-19 LAB — BASIC METABOLIC PANEL
Anion gap: 8 (ref 5–15)
BUN: 10 mg/dL (ref 6–20)
CO2: 27 mmol/L (ref 22–32)
Calcium: 9.6 mg/dL (ref 8.9–10.3)
Chloride: 103 mmol/L (ref 98–111)
Creatinine, Ser: 0.95 mg/dL (ref 0.44–1.00)
GFR, Estimated: >60 mL/min (ref >60)
Glucose, Bld: 98 mg/dL (ref 70–99)
Potassium: 3.8 mmol/L (ref 3.5–5.1)
Sodium: 138 mmol/L (ref 135–145)

## 2021-02-19 LAB — CBC
HCT: 40.2 % (ref 36.0–46.0)
Hemoglobin: 12.8 g/dL (ref 12.0–15.0)
MCH: 25.2 pg — ABNORMAL LOW (ref 26.0–34.0)
MCHC: 31.8 g/dL (ref 30.0–36.0)
MCV: 79.1 fL — ABNORMAL LOW (ref 80.0–100.0)
Platelets: 237 10*3/uL (ref 150–400)
RBC: 5.08 MIL/uL (ref 3.87–5.11)
RDW: 14 % (ref 11.5–15.5)
WBC: 6 10*3/uL (ref 4.0–10.5)
nRBC: 0 % (ref 0.0–0.2)

## 2021-02-19 LAB — TROPONIN I (HIGH SENSITIVITY): Troponin I (High Sensitivity): <2 ng/L (ref <18)

## 2021-02-19 LAB — D-DIMER, QUANTITATIVE: D-Dimer, Quant: 0.29 ug/mL-FEU (ref 0.00–0.50)

## 2021-02-19 NOTE — ED Triage Notes (Signed)
°  Patient comes in with L sided chest pain and L arm pain that started around 1800 tonight.  Patient states she was sitting at her desk and felt sharp pain start in her left arm that radiated to shoulder/neck, then went to her L chest.  No N/V. No diaphoresis.  Patient states it felt like a hot flash and she feels pressure when she takes a deep breath.  No pain at this time.

## 2021-02-20 ENCOUNTER — Emergency Department (HOSPITAL_BASED_OUTPATIENT_CLINIC_OR_DEPARTMENT_OTHER)
Admission: EM | Admit: 2021-02-20 | Discharge: 2021-02-20 | Disposition: A | Discharge status: Home / Self Care | Payer: Self-pay | Attending: Emergency Medicine | Admitting: Emergency Medicine

## 2021-02-20 DIAGNOSIS — R002 Palpitations: Secondary | ICD-10-CM

## 2021-02-20 LAB — PREGNANCY, URINE: Preg Test, Ur: NEGATIVE

## 2021-02-20 LAB — TROPONIN I (HIGH SENSITIVITY): Troponin I (High Sensitivity): <2 ng/L (ref <18)

## 2021-02-20 NOTE — ED Provider Notes (Signed)
[facility] EMERGENCY DEPT Provider Note   CSN: [id number] Arrival date & time: 02/19/21  1947     History  Chief Complaint  Patient presents with   Chest Pain    Mary Roy is a 46 y.o. female.  Patient has 2 separate symptoms.  First 1 is that she for the last 2 to 3 weeks she has had intermittent sharp shooting pain down her left arm coming from her shoulder.  States it does seem to be worse with certain movements of her head.  Tonight she had an episode where she was sitting at the computer and she had some chest pain that was sharp and then she felt some fluttering.  Since that time she has had a couple episodes of fluttering without instigation.  She never had that before.  No actual chest pain mostly just a fluttering and palpitation type feeling.  No change in diet recently.  No change in medications recently.  No other associated symptoms such as nausea, vomiting, lightheadedness, diaphoresis or shortness of breath.  No trauma.  No cardiac disease history. Sometimes will have a pleuritic pressure in her chest.    Chest Pain     Home Medications Prior to Admission medications   Medication Sig Start Date End Date Taking? Authorizing Provider  oseltamivir (TAMIFLU) 75 MG capsule Take 1 capsule (75 mg total) by mouth every 12 (twelve) hours. 03/04/18   Terrilee Files, MD  pseudoephedrine-acetaminophen (TYLENOL SINUS) 30-500 MG TABS tablet Take 2 tablets by mouth every 6 (six) hours as needed (cold symptoms).    [provider]      Allergies    Patient has no known allergies.    Review of Systems   Review of Systems  Cardiovascular:  Positive for chest pain.   Physical Exam Updated Vital Signs BP 122/74    Pulse 67    Temp 97.8 F (36.6 C)    Resp 18    Ht 5\' 7"  (1.702 m)    Wt 72.6 kg    LMP 02/09/2021    SpO2 100%    BMI 25.06 kg/m  Physical Exam Vitals and nursing note reviewed.  Constitutional:      Appearance: She is  well-developed.  HENT:     Head: Normocephalic and atraumatic.  Cardiovascular:     Rate and Rhythm: Normal rate and regular rhythm.  Pulmonary:     Effort: No respiratory distress.     Breath sounds: Normal breath sounds. No stridor. No decreased breath sounds or wheezing.  Chest:     Chest wall: No mass, deformity or tenderness.  Abdominal:     General: There is no distension.     Palpations: Abdomen is soft.  Musculoskeletal:        General: Normal range of motion.     Cervical back: Normal range of motion.  Skin:    General: Skin is warm and dry.  Neurological:     General: No focal deficit present.     Mental Status: She is alert.    ED Results / Procedures / Treatments   Labs (all labs ordered are listed, but only abnormal results are displayed) Labs Reviewed  CBC - Abnormal; Notable for the following components:      Result Value   MCV 79.1 (*)    MCH 25.2 (*)    All other components within normal limits  BASIC METABOLIC PANEL  PREGNANCY, URINE  D-DIMER, QUANTITATIVE  TROPONIN I (HIGH SENSITIVITY)  TROPONIN  I (HIGH SENSITIVITY)    EKG EKG Interpretation  Date/Time:  Friday February 19 2021 20:01:20 EST Ventricular Rate:  70 PR Interval:  146 QRS Duration: 80 QT Interval:  360 QTC Calculation: 388 R Axis:   86 Text Interpretation: Normal sinus rhythm Nonspecific ST and T wave abnormality Abnormal ECG When compared with ECG of 04-Mar-2018 09:30, PREVIOUS ECG IS PRESENT Confirmed by Edwin Dada ([id number]) on 02/19/2021 8:13:01 PM  Radiology DG Chest 2 View  Result Date: 02/19/2021 CLINICAL DATA:  Left side chest pain EXAM: CHEST - 2 VIEW COMPARISON:  03/04/2018 FINDINGS: The heart size and mediastinal contours are within normal limits. Both lungs are clear. The visualized skeletal structures are unremarkable. IMPRESSION: No active cardiopulmonary disease. Electronically Signed   By: Charlett Nose M.D.   On: 02/19/2021 21:23    Procedures Procedures     Medications Ordered in ED Medications - No data to display  ED Course/ Medical Decision Making/ A&P                           Medical Decision Making Amount and/or Complexity of Data Reviewed Labs: ordered. Radiology: ordered.   I suspect she has a possible radiculopathy of her left arm and is unlikely related to her palpitations.   D dimer/troponin/ecg/cxr WNL. Low suspicion for ACS, PE, dissection, infection or other emergent etiology for her palpitations.  No electrolyte abnormalities to suggest those as a cause.  Did not have any while on the monitor here for couple hours.  Overall patient appears well and is asymptomatic.  Will discharge to follow-up with cardiology as needed for palpitations.  PCP follow-up for her neck and arm pain.   Final Clinical Impression(s) / ED Diagnoses Final diagnoses:  Palpitations    Rx / DC Orders ED Discharge Orders          Ordered    Ambulatory referral to Cardiology        02/20/21 0239              Zinedine Ellner, Barbara Cower, MD 02/20/21 [phone]

## 2021-03-29 ENCOUNTER — Encounter (HOSPITAL_BASED_OUTPATIENT_CLINIC_OR_DEPARTMENT_OTHER): Payer: Self-pay | Admitting: Cardiovascular Disease

## 2021-03-29 ENCOUNTER — Other Ambulatory Visit: Payer: Self-pay

## 2021-03-29 ENCOUNTER — Ambulatory Visit (INDEPENDENT_AMBULATORY_CARE_PROVIDER_SITE_OTHER): Payer: Self-pay | Admitting: Cardiovascular Disease

## 2021-03-29 ENCOUNTER — Ambulatory Visit (INDEPENDENT_AMBULATORY_CARE_PROVIDER_SITE_OTHER): Payer: Self-pay

## 2021-03-29 VITALS — BP 128/78 | HR 74 | Ht 67.0 in | Wt 173.1 lb

## 2021-03-29 DIAGNOSIS — Z13220 Encounter for screening for lipoid disorders: Secondary | ICD-10-CM

## 2021-03-29 DIAGNOSIS — R0789 Other chest pain: Secondary | ICD-10-CM

## 2021-03-29 DIAGNOSIS — R002 Palpitations: Secondary | ICD-10-CM

## 2021-03-29 HISTORY — DX: Other chest pain: R07.89

## 2021-03-29 HISTORY — DX: Palpitations: R00.2

## 2021-03-29 NOTE — Assessment & Plan Note (Addendum)
Symptoms are very atypical and sound more like radiculopathy than ischemic in etiology.  She has no exertional symptoms.  Recommended that she increase her exercise to at least 150 minutes weekly.  No plan for an ischemic evaluation at this time.  Cardiac enzymes and D-dimer are unremarkable.  She will come back for fasting lipids and LFTs.

## 2021-03-29 NOTE — Assessment & Plan Note (Signed)
Her symptoms sound like PACs or PVCs.  We will check a TSH, magnesium, and LFTs.  CBC were unremarkable.  She is already limiting caffeine and does not drink alcohol.  She has not been particularly stressed or anxious.  We will get a 3-day ZIO to confirm her arrhythmia.

## 2021-03-29 NOTE — Progress Notes (Signed)
Cardiology Office Note   Date:  03/29/2021   ID:  Mary Roy, DOB [birth year], MRN [id number]  PCP:  Patient, No Pcp Per (Inactive)  Cardiologist:   Chilton Si, MD   No chief complaint on file.     History of Present Illness: Mary Roy is a 46 y.o. female who is being seen today for the evaluation of palpitations at the request of Mesner, Barbara Cower, MD. she was seen in the ED 01/2021 with palpitations and shooting pain in her left arm.  Cardiac enzymes were negative as was D-dimer.  EKG revealed sinus rhythm with nonspecific ST changes.  They recommended outpatient follow-up with cardiology.  She was sitting at her desk and she started feeling pain in her chest and radiation into her L arm.  She also noted tingling in her arm.  Her symptoms do not change with exertion, though she does not get much exercise.  She has been having a daily sensation in her chest that last for a few seconds at a time.  There is no associated lightheadedness or dizziness.  It seems to occur randomly and always at risk.  She also notes some tingling into her left arm.  She notes a fluttering sensation and has a pulling sensation when she takes a deep breath.  She denies lower extremity edema, orthopnea, or PND.  She has not been feeling particularly stressed or anxious.  She does not drink caffeine or alcohol.   Past Medical History:  Diagnosis Date   Atypical chest pain 03/29/2021   Palpitations 03/29/2021    History reviewed. No pertinent surgical history.   No current outpatient medications on file.   No current facility-administered medications for this visit.    Allergies:   Patient has no known allergies.    Social History:  The patient  reports that she has never smoked. She has never used smokeless tobacco.   Family History:  The patient's family history includes Heart attack in her maternal grandmother; Hypertension in her maternal grandmother.    ROS:  Please see the  history of present illness.   Otherwise, review of systems are positive for none.   All other systems are reviewed and negative.    PHYSICAL EXAM: VS:  BP 128/78    Pulse 74    Ht 5\' 7"  (1.702 m)    Wt 173 lb 1.6 oz (78.5 kg)    SpO2 98%    BMI 27.11 kg/m  , BMI Body mass index is 27.11 kg/m. GENERAL:  Well appearing HEENT:  Pupils equal round and reactive, fundi not visualized, oral mucosa unremarkable NECK:  No jugular venous distention, waveform within normal limits, carotid upstroke brisk and symmetric, no bruits, no thyromegaly LYMPHATICS:  No cervical adenopathy LUNGS:  Clear to auscultation bilaterally HEART:  RRR.  PMI not displaced or sustained,S1 and S2 within normal limits, no S3, no S4, no clicks, no rubs, no murmurs ABD:  Flat, positive bowel sounds normal in frequency in pitch, no bruits, no rebound, no guarding, no midline pulsatile mass, no hepatomegaly, no splenomegaly EXT:  2 plus pulses throughout, no edema, no cyanosis no clubbing SKIN:  No rashes no nodules NEURO:  Cranial nerves II through XII grossly intact, motor grossly intact throughout PSYCH:  Cognitively intact, oriented to person place and time    EKG:  EKG is not ordered today. 02/19/2021: Sinus rhythm.  Rate 70 bpm.  Nonspecific ST changes.   Recent Labs: 02/19/2021: BUN 10; Creatinine, Ser  0.95; Hemoglobin 12.8; Platelets 237; Potassium 3.8; Sodium 138    Lipid Panel No results found for: CHOL, TRIG, HDL, CHOLHDL, VLDL, LDLCALC, LDLDIRECT    Wt Readings from Last 3 Encounters:  03/29/21 173 lb 1.6 oz (78.5 kg)  02/19/21 160 lb (72.6 kg)  03/04/18 158 lb (71.7 kg)      ASSESSMENT AND PLAN:  Palpitations Her symptoms sound like PACs or PVCs.  We will check a TSH, magnesium, and LFTs.  CBC were unremarkable.  She is already limiting caffeine and does not drink alcohol.  She has not been particularly stressed or anxious.  We will get a 3-day ZIO to confirm her arrhythmia.  Atypical chest  pain Symptoms are very atypical and sound more like radiculopathy than ischemic in etiology.  She has no exertional symptoms.  Recommended that she increase her exercise to at least 150 minutes weekly.  No plan for an ischemic evaluation at this time.  Cardiac enzymes and D-dimer are unremarkable.  She will come back for fasting lipids and LFTs.    Current medicines are reviewed at length with the patient today.  The patient does not have concerns regarding medicines.  The following changes have been made:  none  Labs/ tests ordered today include:  No orders of the defined types were placed in this encounter.    Disposition:   FU with APP in 2 months.  Heidee Audi C. Oval Linsey, MD, [facility] prn     Signed, [place] Oval Linsey, MD, [facility]  03/29/2021 2:18 PM    [place]

## 2021-03-29 NOTE — Progress Notes (Unsigned)
Enrolled for [employer] to mail a ZIO XT long term holter monitor to the patients address on file.  

## 2021-03-29 NOTE — Patient Instructions (Addendum)
.Medication Instructions:  Your physician recommends that you continue on your current medications as directed. Please refer to the Current Medication list given to you today.   *If you need a refill on your cardiac medications before your next appointment, please call your pharmacy*  Lab Work: LP/TSH/MAGNESIUM/LFT'S SOON   If you have labs (blood work) drawn today and your tests are completely normal, you will receive your results only by: [place] (if you have MyChart) OR A paper copy in the mail If you have any lab test that is abnormal or we need to change your treatment, we will call you to review the results.  Testing/Procedures: 3 DAY ZIO MONITOR   Follow-Up: At [facility], you and your health needs are our priority.  As part of our continuing mission to provide you with exceptional heart care, we have created designated Provider Care Teams.  These Care Teams include your primary Cardiologist (physician) and Advanced Practice Providers (APPs -  Physician Assistants and Nurse Practitioners) who all work together to provide you with the care you need, when you need it.  We recommend signing up for the patient portal called "MyChart".  Sign up information is provided on this After Visit Summary.  MyChart is used to connect with patients for Virtual Visits (Telemedicine).  Patients are able to view lab/test results, encounter notes, upcoming appointments, etc.  Non-urgent messages can be sent to your provider as well.   To learn more about what you can do with MyChart, go to [email or web address].    Your next appointment:   2 month(s)  The format for your next appointment:   In Person  Provider:   Laurann Montana, NP    Other Instructions  [place] Monitor Instructions  Your physician has requested you wear a ZIO patch monitor for 3 days.  This is a single patch monitor. [employer] supplies one patch monitor per enrollment. Additional stickers are not  available. Please do not apply patch if you will be having a Nuclear Stress Test,  Echocardiogram, Cardiac CT, MRI, or Chest Xray during the period you would be wearing the  monitor. The patch cannot be worn during these tests. You cannot remove and re-apply the  ZIO XT patch monitor.  Your ZIO patch monitor will be mailed 3 day USPS to your address on file. It may take 3-5 days  to receive your monitor after you have been enrolled.  Once you have received your monitor, please review the enclosed instructions. Your monitor  has already been registered assigning a specific monitor serial # to you.  Billing and Patient Assistance Program Information  We have supplied [employer] with any of your insurance information on file for billing purposes. [employer] offers a sliding scale Patient Assistance Program for patients that do not have  insurance, or whose insurance does not completely cover the cost of the ZIO monitor.  You must apply for the Patient Assistance Program to qualify for this discounted rate.  To apply, please call [employer] at [phone], select option 4, select option 2, ask to apply for  Patient Assistance Program. Theodore Demark will ask your household income, and how many people  are in your household. They will quote your out-of-pocket cost based on that information.  [employer] will also be able to set up a 87-month, interest-free payment plan if needed.  Applying the monitor   Shave hair from upper left chest.  Hold abrader disc by orange tab. Rub abrader in 40 strokes over the  upper left chest as  indicated in your monitor instructions.  Clean area with 4 enclosed alcohol pads. Let dry.  Apply patch as indicated in monitor instructions. Patch will be placed under collarbone on left  side of chest with arrow pointing upward.  Rub patch adhesive wings for 2 minutes. Remove white label marked "1". Remove the white  label marked "2". Rub patch adhesive wings for 2 additional minutes.   While looking in a mirror, press and release button in center of patch. A small green light will  flash 3-4 times. This will be your only indicator that the monitor has been turned on.  Do not shower for the first 24 hours. You may shower after the first 24 hours.  Press the button if you feel a symptom. You will hear a small click. Record Date, Time and  Symptom in the Patient Logbook.  When you are ready to remove the patch, follow instructions on the last 2 pages of Patient  Logbook. Stick patch monitor onto the last page of Patient Logbook.  Place Patient Logbook in the blue and white box. Use locking tab on box and tape box closed  securely. The blue and white box has prepaid postage on it. Please place it in the mailbox as  soon as possible. Your physician should have your test results approximately 7 days after the  monitor has been mailed back to [facility].  Call [facility] at [phone] if you have questions regarding  your ZIO XT patch monitor. Call them immediately if you see an orange light blinking on your  monitor.  If your monitor falls off in less than 4 days, contact our Monitor department at [phone].  If your monitor becomes loose or falls off after 4 days call [employer] at [phone] for  suggestions on securing your monitor

## 2021-04-01 DIAGNOSIS — R002 Palpitations: Secondary | ICD-10-CM

## 2021-04-06 LAB — LIPID PANEL
Chol/HDL Ratio: 2.6 ratio (ref 0.0–4.4)
Cholesterol, Total: 155 mg/dL (ref 100–199)
HDL: 59 mg/dL (ref >39)
LDL Chol Calc (NIH): 86 mg/dL (ref 0–99)
Triglycerides: 44 mg/dL (ref 0–149)
VLDL Cholesterol Cal: 10 mg/dL (ref 5–40)

## 2021-04-06 LAB — HEPATIC FUNCTION PANEL
ALT: 9 IU/L (ref 0–32)
AST: 17 IU/L (ref 0–40)
Albumin: 4.2 g/dL (ref 3.8–4.8)
Alkaline Phosphatase: 96 IU/L (ref 44–121)
Bilirubin Total: 0.3 mg/dL (ref 0.0–1.2)
Bilirubin, Direct: <0.1 mg/dL (ref 0.00–0.40)
Total Protein: 7.2 g/dL (ref 6.0–8.5)

## 2021-04-06 LAB — TSH: TSH: 0.83 u[IU]/mL (ref 0.450–4.500)

## 2021-04-06 LAB — MAGNESIUM: Magnesium: 1.9 mg/dL (ref 1.6–2.3)

## 2021-05-25 NOTE — Progress Notes (Deleted)
   Office Visit    Patient Name: Mary Roy Date of Encounter: 05/25/2021  PCP:  Patient, No Pcp Per (Inactive)   [facility]  Cardiologist:  Chilton Si, MD *** Advanced Practice Provider:  No care team member to display Electrophysiologist:  None      Chief Complaint    Mary Roy is a 46 y.o. female with a hx of palpitations, chest pain presents today for ***   Past Medical History    Past Medical History:  Diagnosis Date   Atypical chest pain 03/29/2021   Palpitations 03/29/2021   No past surgical history on file.  Allergies  No Known Allergies  History of Present Illness    Mary Roy is a 46 y.o. female with a hx of palpitations, chest pain last seen 03/29/21 by Dr. Duke Salvia.  Seen 03/29/21 by Dr. Duke Salvia due to palpitations. 3 day ZIO placed in clinic revealed predominantly NSR with rare PAC/PVC. Triggered episodes were associated with NSR. Her chest pain was atypical in nature and consistent with radiculopathy. No ischemic evaluation recommended. Lab work including lipid panel, magnesium, TSH, liver function unremarkable.   She presents today for follow up. ***   EKGs/Labs/Other Studies Reviewed:   The following studies were reviewed today:  Monitor 03/29/21 Quality: Fair.  Baseline artifact. Predominant rhythm: sinus rhythm Average heart rate: 79 bpm Max heart rate: 144 bpm Min heart rate: 56 bpm Pauses >2.5 seconds: none   Rare PACs and PVCs Patient triggered monitor several times, at which time sinus rhythm was noted.   EKG:  No EKG today  Recent Labs: 02/19/2021: BUN 10; Creatinine, Ser 0.95; Hemoglobin 12.8; Platelets 237; Potassium 3.8; Sodium 138 04/05/2021: ALT 9; Magnesium 1.9; TSH 0.830  Recent Lipid Panel    Component Value Date/Time   CHOL 155 04/05/2021 1049   TRIG 44 04/05/2021 1049   HDL 59 04/05/2021 1049   CHOLHDL 2.6 04/05/2021 1049   LDLCALC 86 04/05/2021 1049     Home  Medications   No outpatient medications have been marked as taking for the 05/27/21 encounter (Appointment) with Alver Sorrow, NP.     Review of Systems      All other systems reviewed and are otherwise negative except as noted above.  Physical Exam    VS:  There were no vitals taken for this visit. , BMI There is no height or weight on file to calculate BMI.  Wt Readings from Last 3 Encounters:  03/29/21 173 lb 1.6 oz (78.5 kg)  02/19/21 160 lb (72.6 kg)  03/04/18 158 lb (71.7 kg)     GEN: Well nourished, well developed, in no acute distress. HEENT: normal. Neck: Supple, no JVD, carotid bruits, or masses. Cardiac: ***RRR, no murmurs, rubs, or gallops. No clubbing, cyanosis, edema.  ***Radials/PT 2+ and equal bilaterally.  Respiratory:  ***Respirations regular and unlabored, clear to auscultation bilaterally. GI: Soft, nontender, nondistended. MS: No deformity or atrophy. Skin: Warm and dry, no rash. Neuro:  Strength and sensation are intact. Psych: Normal affect.  Assessment & Plan    Palpitations - ZIO 03/29/21 with no significant arrhythmia. ***  Atypical chest pain -     Disposition: Follow up {follow up:15908} with Chilton Si, MD or APP.  Signed, Alver Sorrow, NP 05/25/2021, 7:52 PM [facility]

## 2021-05-27 ENCOUNTER — Ambulatory Visit (HOSPITAL_BASED_OUTPATIENT_CLINIC_OR_DEPARTMENT_OTHER): Payer: Self-pay | Admitting: Family

## 2023-02-09 ENCOUNTER — Telehealth: Payer: Self-pay | Admitting: Dermatology

## 2023-03-06 ENCOUNTER — Ambulatory Visit: Payer: Self-pay | Admitting: Dermatology

## 2023-08-28 IMAGING — DX DG CHEST 2V
2 series · 2 of 2 positions shown · non-contrast
Comparison: 03/04/2018

CLINICAL DATA: Left side chest pain

EXAM:
CHEST - 2 VIEW

[chest pa]
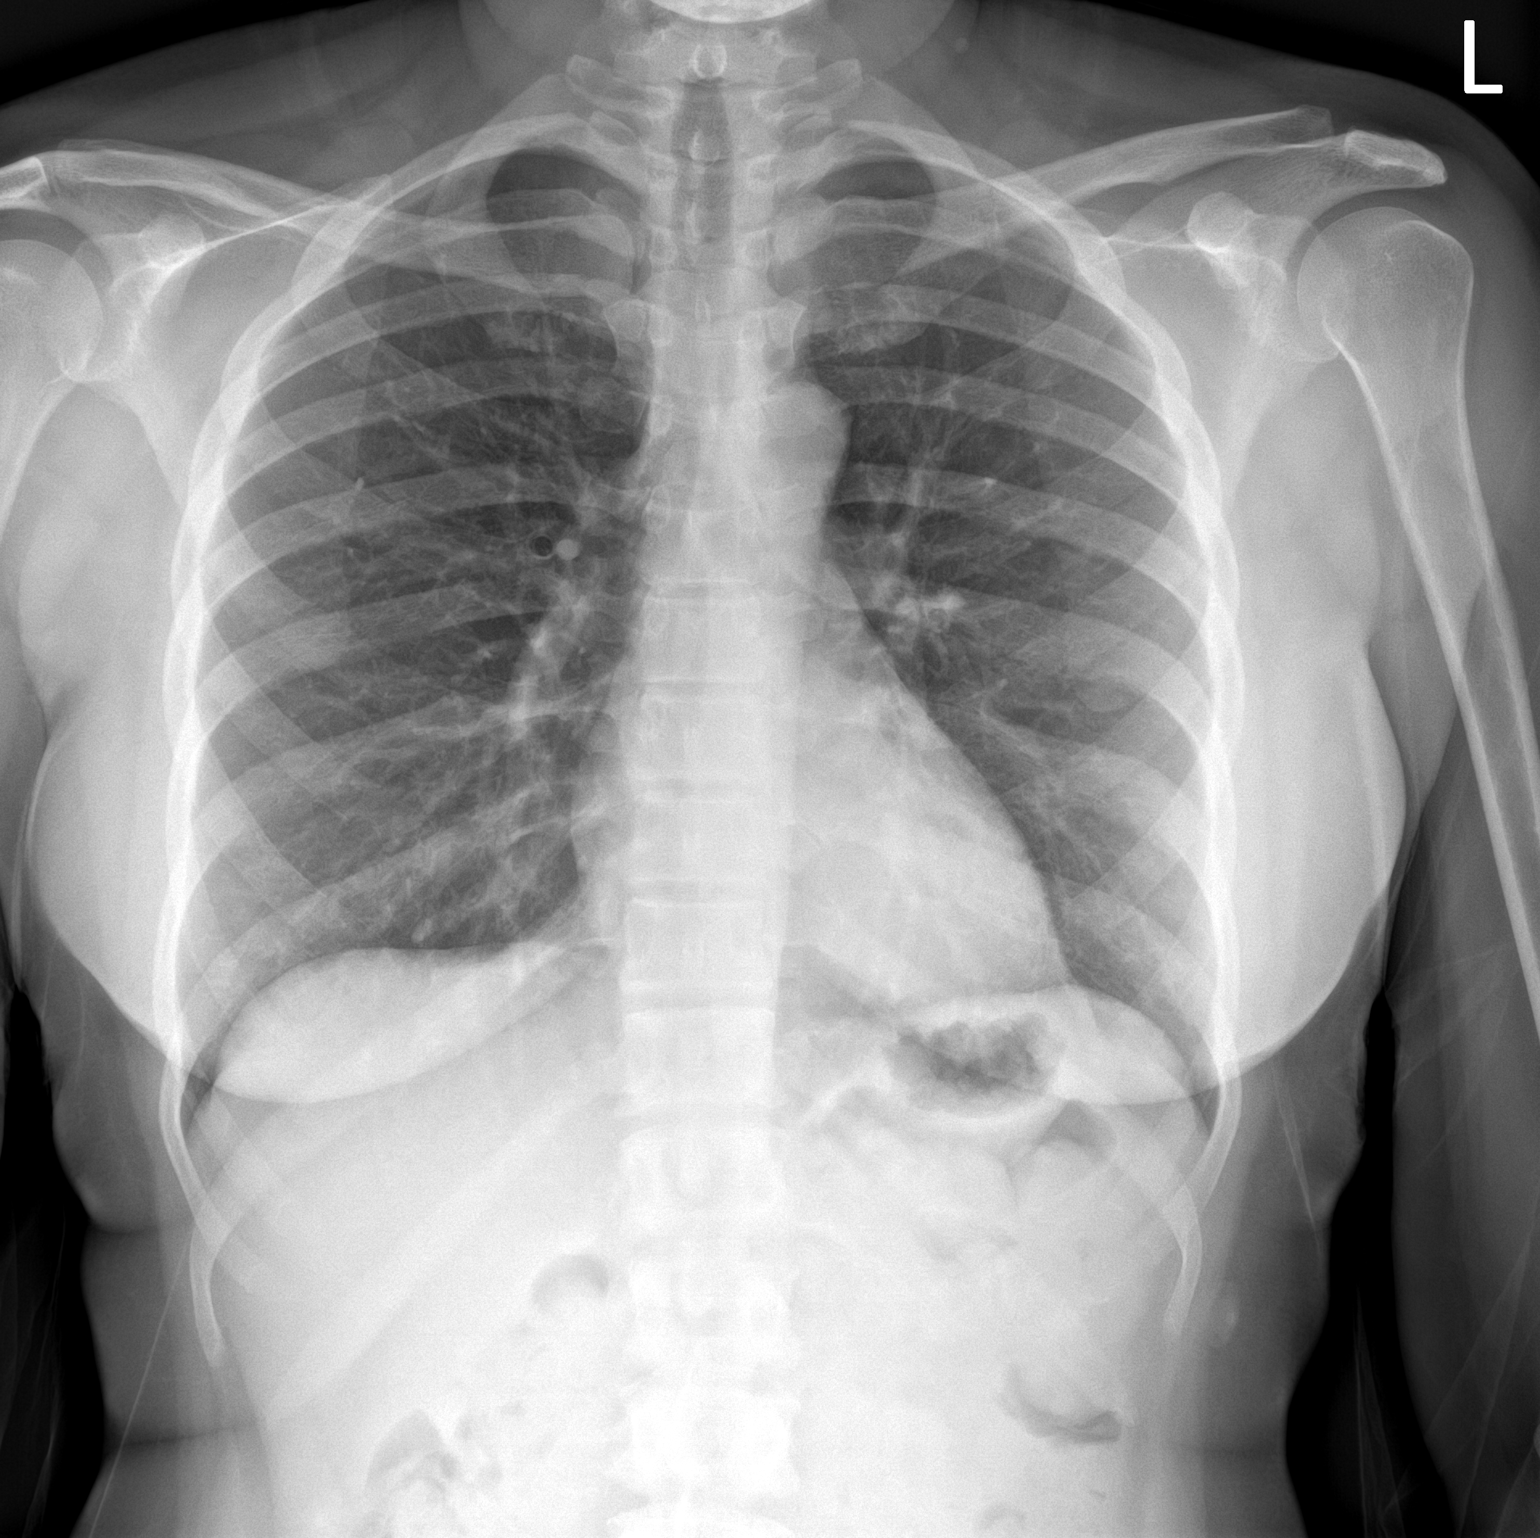

[chest lat]
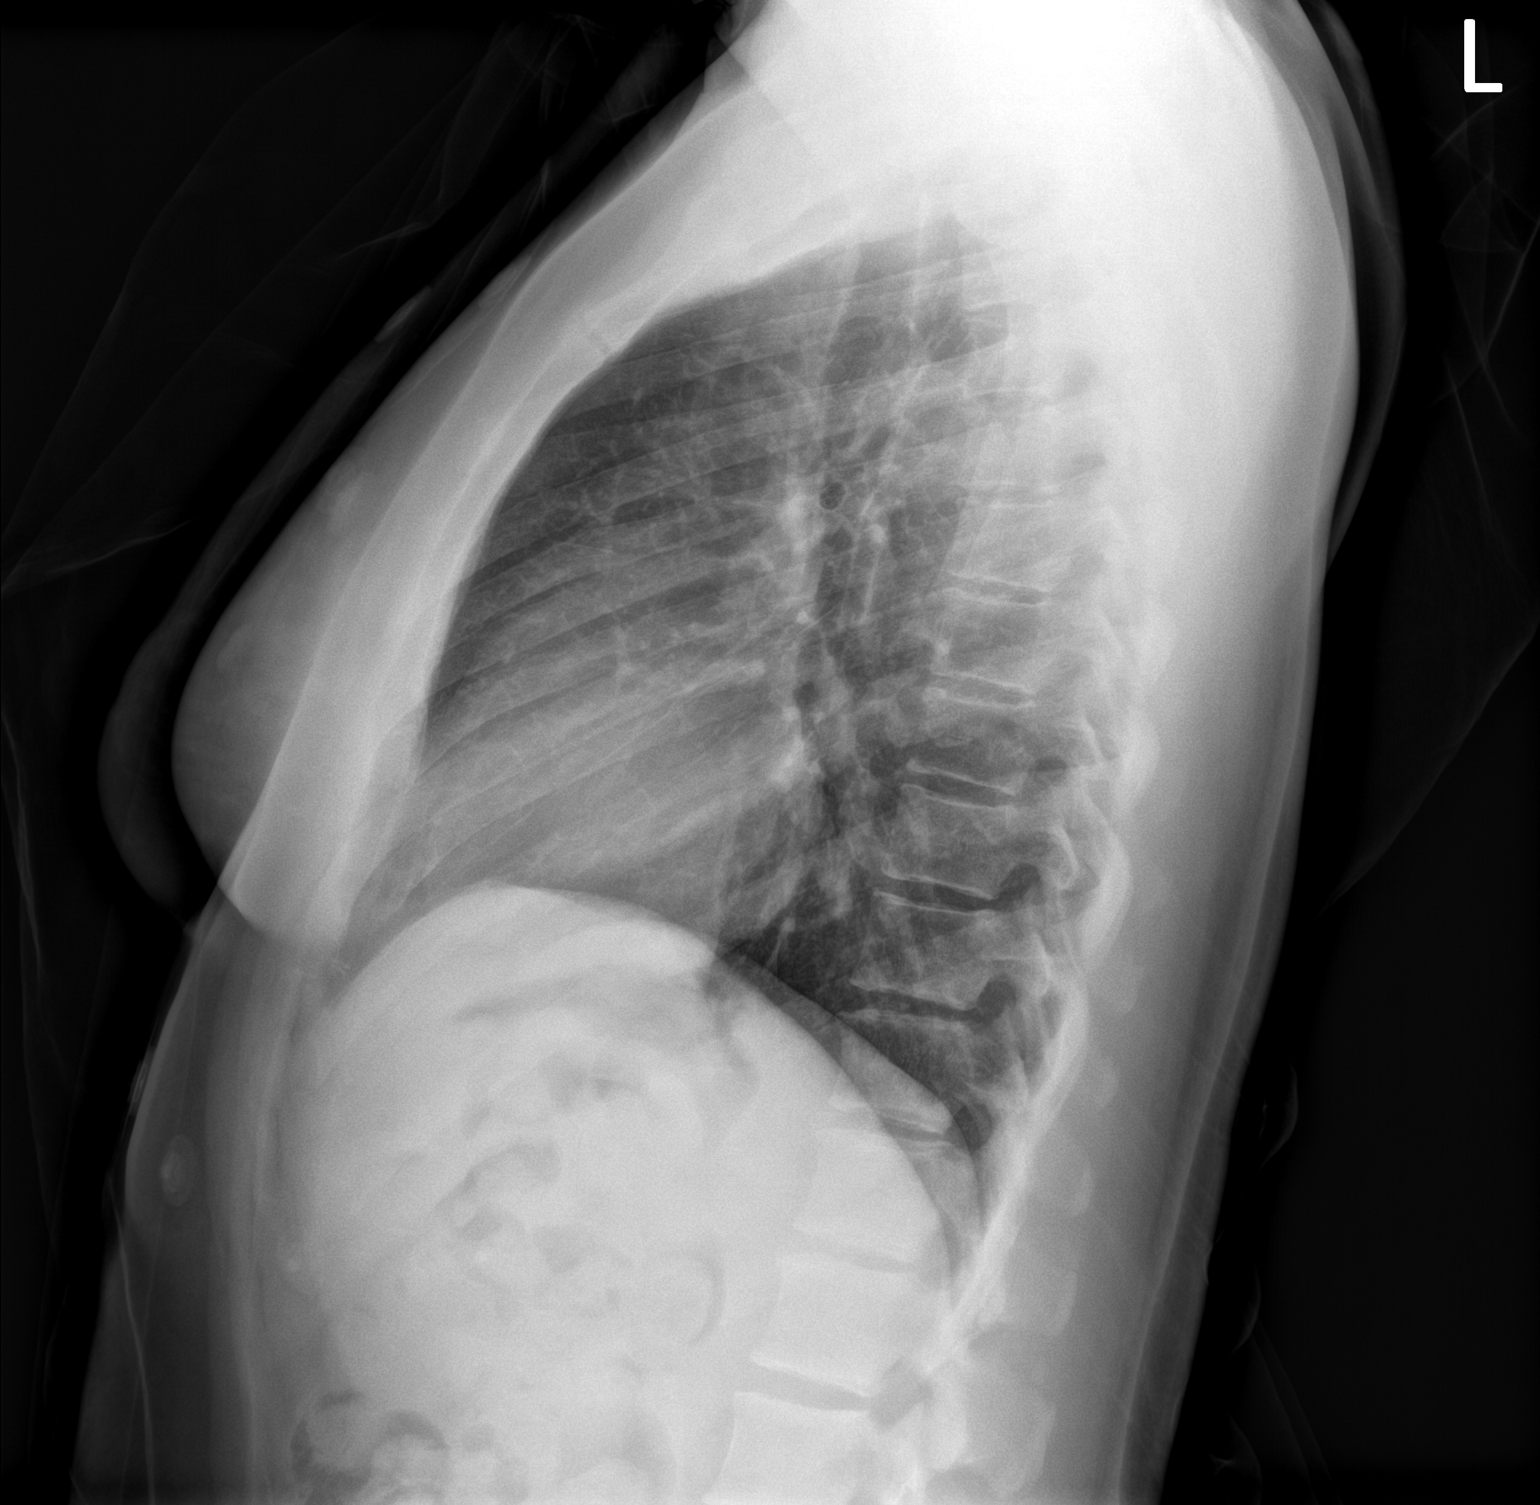

[2 of 2 positions shown; findings below may reference images not displayed]

FINDINGS: The heart size and mediastinal contours are within normal limits.
Both lungs are clear. The visualized skeletal structures are
unremarkable.
IMPRESSION: No active cardiopulmonary disease.
